# Patient Record
Sex: Male | Born: 1965 | Race: Black or African American | Hispanic: No | Marital: Married | Smoking: Never smoker
Health system: Southern US, Community
[De-identification: ages and names within clinical notes are randomized; demographics above are authoritative.]

## PROBLEM LIST (undated history)

## (undated) DIAGNOSIS — I10 Essential (primary) hypertension: Secondary | ICD-10-CM

---

## 2016-12-26 ENCOUNTER — Emergency Department (HOSPITAL_COMMUNITY)
Admission: EM | Admit: 2016-12-26 | Discharge: 2016-12-27 | Disposition: A | Discharge status: Home / Self Care | Payer: BLUE CROSS/BLUE SHIELD | Attending: Emergency Medicine | Admitting: Emergency Medicine

## 2016-12-26 ENCOUNTER — Encounter (HOSPITAL_COMMUNITY): Payer: Self-pay | Admitting: Emergency Medicine

## 2016-12-26 DIAGNOSIS — N1330 Unspecified hydronephrosis: Secondary | ICD-10-CM | POA: Insufficient documentation

## 2016-12-26 DIAGNOSIS — R1084 Generalized abdominal pain: Secondary | ICD-10-CM | POA: Diagnosis present

## 2016-12-26 DIAGNOSIS — N201 Calculus of ureter: Secondary | ICD-10-CM | POA: Diagnosis not present

## 2016-12-26 DIAGNOSIS — R112 Nausea with vomiting, unspecified: Secondary | ICD-10-CM | POA: Diagnosis not present

## 2016-12-26 DIAGNOSIS — N23 Unspecified renal colic: Secondary | ICD-10-CM

## 2016-12-26 DIAGNOSIS — I10 Essential (primary) hypertension: Secondary | ICD-10-CM | POA: Insufficient documentation

## 2016-12-26 DIAGNOSIS — N134 Hydroureter: Secondary | ICD-10-CM | POA: Insufficient documentation

## 2016-12-26 DIAGNOSIS — K769 Liver disease, unspecified: Secondary | ICD-10-CM | POA: Insufficient documentation

## 2016-12-26 DIAGNOSIS — N200 Calculus of kidney: Secondary | ICD-10-CM | POA: Diagnosis not present

## 2016-12-26 DIAGNOSIS — R748 Abnormal levels of other serum enzymes: Secondary | ICD-10-CM | POA: Insufficient documentation

## 2016-12-26 DIAGNOSIS — R109 Unspecified abdominal pain: Secondary | ICD-10-CM

## 2016-12-26 HISTORY — DX: Essential (primary) hypertension: I10

## 2016-12-26 LAB — COMPREHENSIVE METABOLIC PANEL
ALBUMIN: 4.9 g/dL (ref 3.5–5.0)
ALK PHOS: 60 U/L (ref 38–126)
ALT: 18 U/L (ref 17–63)
ANION GAP: 13 (ref 5–15)
AST: 27 U/L (ref 15–41)
BUN: 13 mg/dL (ref 6–20)
CALCIUM: 10 mg/dL (ref 8.9–10.3)
CHLORIDE: 106 mmol/L (ref 101–111)
CO2: 22 mmol/L (ref 22–32)
Creatinine, Ser: 1.77 mg/dL — ABNORMAL HIGH (ref 0.61–1.24)
GFR calc non Af Amer: 42 mL/min — ABNORMAL LOW (ref >60)
GFR, EST AFRICAN AMERICAN: 49 mL/min — AB (ref >60)
GLUCOSE: 109 mg/dL — AB (ref 65–99)
Potassium: 3.9 mmol/L (ref 3.5–5.1)
SODIUM: 141 mmol/L (ref 135–145)
Total Bilirubin: 2.1 mg/dL — ABNORMAL HIGH (ref 0.3–1.2)
Total Protein: 8 g/dL (ref 6.5–8.1)

## 2016-12-26 LAB — URINALYSIS, ROUTINE W REFLEX MICROSCOPIC
BACTERIA UA: NONE SEEN
BILIRUBIN URINE: NEGATIVE
Glucose, UA: NEGATIVE mg/dL
KETONES UR: 20 mg/dL — AB
Leukocytes, UA: NEGATIVE
Nitrite: NEGATIVE
PH: 5 (ref 5.0–8.0)
PROTEIN: 30 mg/dL — AB
SQUAMOUS EPITHELIAL / LPF: NONE SEEN
Specific Gravity, Urine: 1.023 (ref 1.005–1.030)

## 2016-12-26 LAB — CBC
HEMATOCRIT: 43.2 % (ref 39.0–52.0)
HEMOGLOBIN: 14.8 g/dL (ref 13.0–17.0)
MCH: 29.3 pg (ref 26.0–34.0)
MCHC: 34.3 g/dL (ref 30.0–36.0)
MCV: 85.5 fL (ref 78.0–100.0)
Platelets: 226 10*3/uL (ref 150–400)
RBC: 5.05 MIL/uL (ref 4.22–5.81)
RDW: 13.3 % (ref 11.5–15.5)
WBC: 11.7 10*3/uL — ABNORMAL HIGH (ref 4.0–10.5)

## 2016-12-26 LAB — LIPASE, BLOOD: LIPASE: 20 U/L (ref 11–51)

## 2016-12-26 NOTE — ED Notes (Signed)
Pt from home with c/o generalized abdominal pain since Monday along with constipation. Pt states he has had 3 episodes of emesis today. Pt has hyperactive bowel sounds in all 4 quadrants

## 2016-12-27 ENCOUNTER — Emergency Department (HOSPITAL_COMMUNITY): Payer: BLUE CROSS/BLUE SHIELD

## 2016-12-27 MED ORDER — OXYCODONE-ACETAMINOPHEN 5-325 MG PO TABS
2.0000 | ORAL_TABLET | Freq: Once | ORAL | Status: AC
  Administered 2016-12-27: 2 via ORAL
  Filled 2016-12-27: qty 2

## 2016-12-27 MED ORDER — ONDANSETRON 8 MG PO TBDP: ORAL_TABLET | ORAL | 0 refills | Status: DC

## 2016-12-27 MED ORDER — FENTANYL CITRATE (PF) 100 MCG/2ML IJ SOLN
50.0000 ug | Freq: Once | INTRAMUSCULAR | Status: AC
  Administered 2016-12-27: 50 ug via INTRAVENOUS
  Filled 2016-12-27: qty 2

## 2016-12-27 MED ORDER — SODIUM CHLORIDE 0.9 % IV BOLUS (SEPSIS)
500.0000 mL | Freq: Once | INTRAVENOUS | Status: AC
  Administered 2016-12-27: 500 mL via INTRAVENOUS

## 2016-12-27 MED ORDER — OXYCODONE-ACETAMINOPHEN 5-325 MG PO TABS: 1.0000 | ORAL_TABLET | ORAL | 0 refills | Status: DC | PRN

## 2016-12-27 MED ORDER — TAMSULOSIN HCL 0.4 MG PO CAPS
0.8000 mg | ORAL_CAPSULE | Freq: Once | ORAL | Status: AC
  Administered 2016-12-27: 0.8 mg via ORAL
  Filled 2016-12-27: qty 2

## 2016-12-27 MED ORDER — ONDANSETRON HCL 4 MG/2ML IJ SOLN
4.0000 mg | Freq: Once | INTRAMUSCULAR | Status: AC
  Administered 2016-12-27: 4 mg via INTRAVENOUS
  Filled 2016-12-27: qty 2

## 2016-12-27 MED ORDER — KETOROLAC TROMETHAMINE 30 MG/ML IJ SOLN
30.0000 mg | Freq: Once | INTRAMUSCULAR | Status: AC
Start: 2016-12-27 — End: 2016-12-27
  Administered 2016-12-27: 30 mg via INTRAVENOUS
  Filled 2016-12-27: qty 1

## 2016-12-27 NOTE — Discharge Instructions (Signed)
1. Medications: zofran, percocet, usual home medications 2. Treatment: rest, drink plenty of fluids, advance diet slowly 3. Follow Up: Please followup with your primary doctor in 2-3 days for discussion of your diagnoses, recheck of your labs including kidney function and further evaluation after today's visit; if you do not have a primary care doctor use the resource guide provided to find one; Please return to the ER for persistent vomiting, high fevers or worsening symptoms

## 2016-12-27 NOTE — ED Provider Notes (Signed)
[facility]-EMERGENCY DEPT Provider Note   CSN: [id number] Arrival date & time: 12/26/16  1842     History   Chief Complaint Chief Complaint  Patient presents with  . Abdominal Pain  . Constipation    HPI Austin Cooper is a 51 y.o. male with a hx of HTN presents to the Emergency Department complaining of gradual, persistent, progressively worsening Right flank, RLQ and RUQ pain onset 8am. Associated symptoms include chills, nausea and vomiting.  Emesis was NBNB.  Pt reports last BM was Monday.  Patient was worried about constipation and therefore attempted magnesium citrate without relief.  Nothing makes his flank pain better and palpation makes it worse.  Pt denies fever, chills, headache, neck pain, chest pain, weakness, dizziness, syncope.  Pt denies recent travel, hx of colitis.  Patient denies a history of abdominal surgeries.   The history is provided by the patient and medical records. No language interpreter was used.    Past Medical History:  Diagnosis Date  . Hypertension     There are no active problems to display for this patient.   History reviewed. No pertinent surgical history.     Home Medications    Prior to Admission medications   Medication Sig Start Date End Date Taking? Authorizing Provider  amLODipine-benazepril (LOTREL) 10-20 MG capsule Take 1 capsule by mouth daily. 12/24/16  Yes [provider]  ondansetron (ZOFRAN ODT) 8 MG disintegrating tablet 8mg  ODT q4 hours prn nausea 12/27/16   Herman Mell, Dahlia ClientHannah, PA-C  oxyCODONE-acetaminophen (PERCOCET) 5-325 MG tablet Take 1-2 tablets by mouth every 4 (four) hours as needed. 12/27/16   Ichiro Chesnut, Dahlia ClientHannah, PA-C    Family History No family history on file.  Social History Social History  Substance Use Topics  . Smoking status: Never Smoker  . Smokeless tobacco: Never Used  . Alcohol use No     Allergies   Patient has no known allergies.   Review of Systems Review of Systems    Constitutional: Negative for appetite change, diaphoresis, fatigue, fever and unexpected weight change.  HENT: Negative for mouth sores.   Eyes: Negative for visual disturbance.  Respiratory: Negative for cough, chest tightness, shortness of breath and wheezing.   Cardiovascular: Negative for chest pain.  Gastrointestinal: Positive for abdominal pain, constipation, nausea and vomiting. Negative for diarrhea.  Endocrine: Negative for polydipsia, polyphagia and polyuria.  Genitourinary: Negative for dysuria, frequency, hematuria and urgency.  Musculoskeletal: Negative for back pain and neck stiffness.  Skin: Negative for rash.  Allergic/Immunologic: Negative for immunocompromised state.  Neurological: Negative for syncope, light-headedness and headaches.  Hematological: Does not bruise/bleed easily.  Psychiatric/Behavioral: Negative for sleep disturbance. The patient is not nervous/anxious.   All other systems reviewed and are negative.    Physical Exam Updated Vital Signs BP 121/88 (BP Location: Left Arm)   Pulse 82   Temp 99 F (37.2 C) (Oral)   Resp 18   Ht 5\' 11"  (1.803 m)   Wt 91.6 kg (202 lb)   SpO2 99%   BMI 28.17 kg/m   Physical Exam  Constitutional: He appears well-developed and well-nourished. No distress.  Awake, alert, nontoxic appearance  HENT:  Head: Normocephalic and atraumatic.  Mouth/Throat: Oropharynx is clear and moist. No oropharyngeal exudate.  Eyes: Conjunctivae are normal. No scleral icterus.  Neck: Normal range of motion. Neck supple.  Cardiovascular: Normal rate, regular rhythm and intact distal pulses.   Pulmonary/Chest: Effort normal and breath sounds normal. No respiratory distress. He has no wheezes.  Equal  chest expansion  Abdominal: Soft. Bowel sounds are normal. He exhibits no mass. There is tenderness in the right upper quadrant and right lower quadrant. There is CVA tenderness ( right). There is no rigidity, no rebound, no guarding, no  tenderness at McBurney's point and negative Murphy's sign.  Musculoskeletal: Normal range of motion. He exhibits no edema.  Neurological: He is alert.  Speech is clear and goal oriented Moves extremities without ataxia  Skin: Skin is warm and dry. He is not diaphoretic.  Psychiatric: He has a normal mood and affect.  Nursing note and vitals reviewed.    ED Treatments / Results  Labs (all labs ordered are listed, but only abnormal results are displayed) Labs Reviewed  COMPREHENSIVE METABOLIC PANEL - Abnormal; Notable for the following:       Result Value   Glucose, Bld 109 (*)    Creatinine, Ser 1.77 (*)    Total Bilirubin 2.1 (*)    GFR calc non Af Amer 42 (*)    GFR calc Af Amer 49 (*)    All other components within normal limits  CBC - Abnormal; Notable for the following:    WBC 11.7 (*)    All other components within normal limits  URINALYSIS, ROUTINE W REFLEX MICROSCOPIC - Abnormal; Notable for the following:    Hgb urine dipstick LARGE (*)    Ketones, ur 20 (*)    Protein, ur 30 (*)    All other components within normal limits  LIPASE, BLOOD     Radiology Ct Renal Stone Study  Result Date: 12/27/2016 CLINICAL DATA:  Constipation x4 days with nausea and vomiting x1 day. Right flank pain moving to central lower abdomen. Leukocytosis. EXAM: CT ABDOMEN AND PELVIS WITHOUT CONTRAST TECHNIQUE: Multidetector CT imaging of the abdomen and pelvis was performed following the standard protocol without IV contrast. COMPARISON:  None. FINDINGS: Lower chest: No acute abnormality. The included heart is normal in size without pericardial effusion. There is minimal bibasilar atelectasis. Hepatobiliary: Within the left hepatic lobe is a subtle hypodense masslike abnormality measuring 7.7 x 5.4 x 6.9 cm. Further characterization is not possible given lack of IV contrast. Potentially, this could represent a large cavernous hemangioma other etiologies including carcinoma or metastasis cannot be  entirely excluded. This be further correlated with CT or MRI using a liver lesion protocol and contrast. The gallbladder is unremarkable. Pancreas: Unremarkable. No pancreatic ductal dilatation or surrounding inflammatory changes. Spleen: Normal in size without focal abnormality. Adrenals/Urinary Tract: Normal bilateral adrenal glands. Perinephric stranding with mild right-sided nephromegaly and mild-to-moderate hydroureteronephrosis is identified with a 3 mm calculus within the interstitial bladder portion of the right ureter, almost having passed into the bladder. There are least for or so renal calculi without obstruction on the left the largest is in the upper pole measuring approximately 4 mm. The urinary bladder is physiologically distended. Stomach/Bowel: Stomach is within normal limits. Appendix appears normal. No evidence of bowel wall thickening, distention, or inflammatory changes. Vascular/Lymphatic: Aortoiliac atherosclerosis.  No lymphadenopathy. Reproductive: Prostate is unremarkable. Other: No abdominal wall hernia or abnormality. No abdominopelvic ascites. Musculoskeletal: Lumbar spondylosis with multilevel mild disc space narrowing and broad-based disc bulges from L2 through L5. No acute nor suspicious osseous abnormalities. IMPRESSION: 1. 3 mm distal right ureteral stone almost having passed into the bladder causing mild-to-moderate right-sided hydroureteronephrosis, perinephric fat stranding and nephromegaly. 2. At least 4 or so nonobstructing left-sided renal calculi the largest is 4 mm in the upper pole. 3. Subtle hypodense masslike  abnormality of the left hepatic lobe measuring 7.7 x 5.4 x 6.9 cm. Further characterization not possible given lack of IV contrast. Potentially this could represent a large cavernous hemangioma. Other etiologies are not excluded. Would recommend dedicated non emergent CT or MRI without and with IV contrast using a liver lesion protocol for better correlation unless  this has been documented elsewhere. 4. Lower lumbar degenerative disc disease L2 through L5 with broad-based disc bulges. Electronically Signed   By: Tollie Eth M.D.   On: 12/27/2016 02:34    Procedures Procedures (including critical care time)  Medications Ordered in ED Medications  fentaNYL (SUBLIMAZE) injection 50 mcg (50 mcg Intravenous Given 12/27/16 0151)  ondansetron (ZOFRAN) injection 4 mg (4 mg Intravenous Given 12/27/16 0149)  tamsulosin (FLOMAX) capsule 0.8 mg (0.8 mg Oral Given 12/27/16 0332)  sodium chloride 0.9 % bolus 500 mL (500 mLs Intravenous New Bag/Given 12/27/16 0331)  ketorolac (TORADOL) 30 MG/ML injection 30 mg (30 mg Intravenous Given 12/27/16 0332)  oxyCODONE-acetaminophen (PERCOCET/ROXICET) 5-325 MG per tablet 2 tablet (2 tablets Oral Given 12/27/16 0449)     Initial Impression / Assessment and Plan / ED Course  I have reviewed the triage vital signs and the nursing notes.  Pertinent labs & imaging results that were available during my care of the patient were reviewed by me and considered in my medical decision making (see chart for details).  Clinical Course as of Dec 27 448  Fri Dec 27, 2016  8119 Liver lesion noted as incidental finding.  Pt given a copy of CT scan and recommend close follow-up and repeat imaging with PCP.  Pt states understanding and plans for f/u.  [HM]    Clinical Course User Index [HM] Mairead Schwarzkopf, Dahlia Client, PA-C    Pt has been diagnosed with a Kidney Stone via CT. There is no evidence of significant hydronephrosis, serum creatine slightly elevated, but stone is distal and there is no evidence of infection. Gentle hydration given.  No known baseline creatinine. Vital signs stable and the pt does not have irratractable vomiting. Pt will be dc home with pain medications & has been advised to follow up with PCP and urology for lab recheck in 2 days.   Pt states understanding and is in agreement with the plan.     Final Clinical  Impressions(s) / ED Diagnoses   Final diagnoses:  Abdominal pain, unspecified abdominal location  Ureteral colic  Elevated creatine kinase  Liver lesion    New Prescriptions New Prescriptions   ONDANSETRON (ZOFRAN ODT) 8 MG DISINTEGRATING TABLET    8mg  ODT q4 hours prn nausea   OXYCODONE-ACETAMINOPHEN (PERCOCET) 5-325 MG TABLET    Take 1-2 tablets by mouth every 4 (four) hours as needed.     Mylissa Lambe, Boyd Kerbs 12/27/16 0340    Kassondra Geil, Dahlia Client, PA-C 12/27/16 1478    Zadie Rhine, MD 12/27/16 [phone]

## 2019-01-08 IMAGING — CT CT RENAL STONE PROTOCOL
2 of 3 series · 15 of 46 positions shown, 17 images · non-contrast
Comparison: None.

CLINICAL DATA: Constipation x4 days with nausea and vomiting x1
day. Right flank pain moving to central lower abdomen. Leukocytosis.

EXAM:
CT ABDOMEN AND PELVIS WITHOUT CONTRAST
TECHNIQUE: Multidetector CT imaging of the abdomen and pelvis was performed
following the standard protocol without IV contrast.

[Series 3: lung · axial · 0.67mm/px · z∈[+1484,+1554]mm · 12 of 41 slices shown, 14 images]
[im 3/41  soft-tissue]
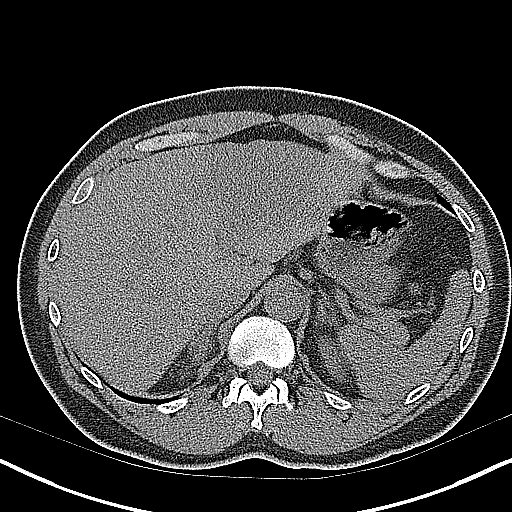
[im 3/41  bone]
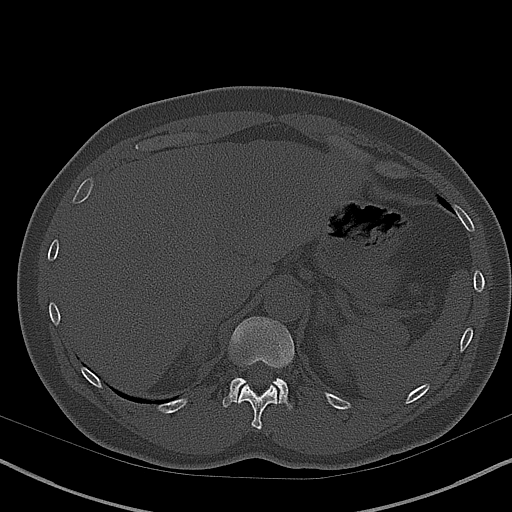
[im 6/41  soft-tissue]
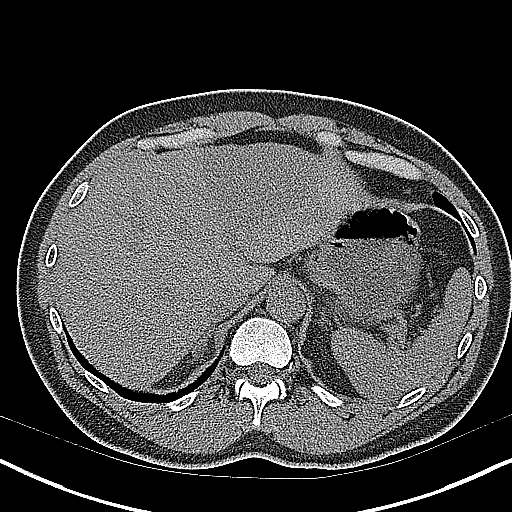
[im 10/41  soft-tissue]
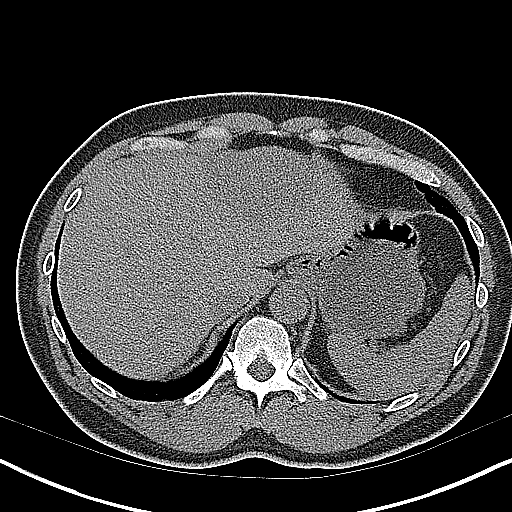
[im 12/41  soft-tissue]
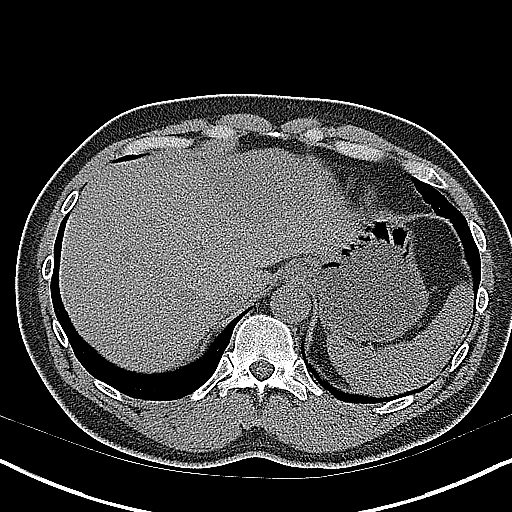
[im 16/41  soft-tissue]
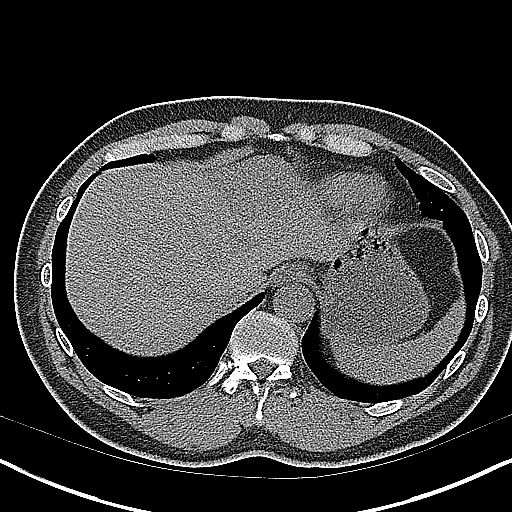
[im 19/41  soft-tissue]
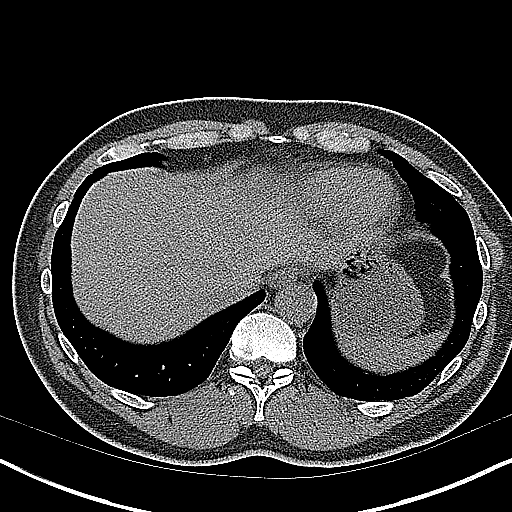
[im 22/41  soft-tissue]
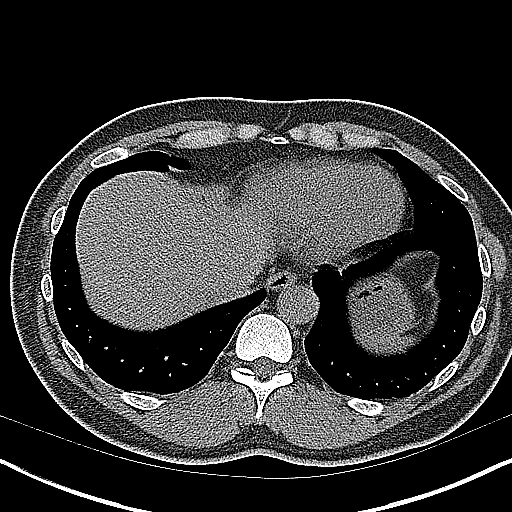
[im 25/41  soft-tissue]
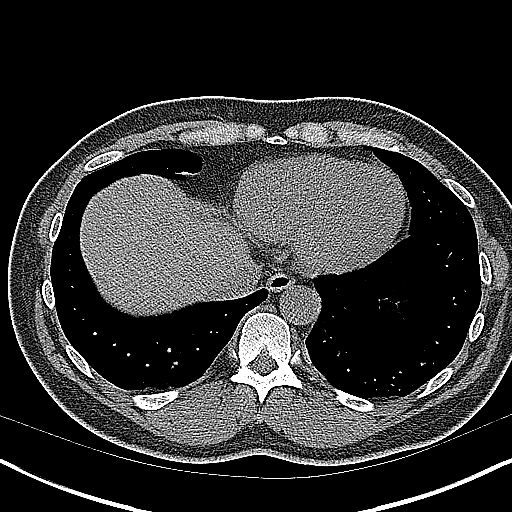
[im 29/41  soft-tissue]
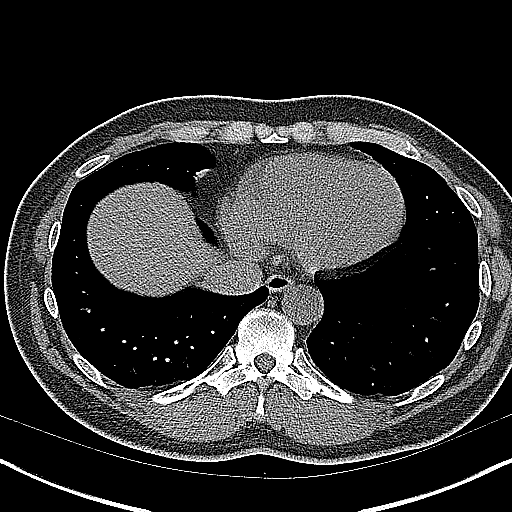
[im 29/41  bone]
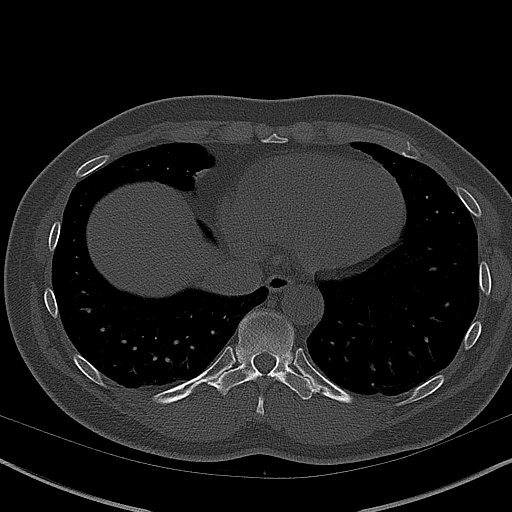
[im 31/41  soft-tissue]
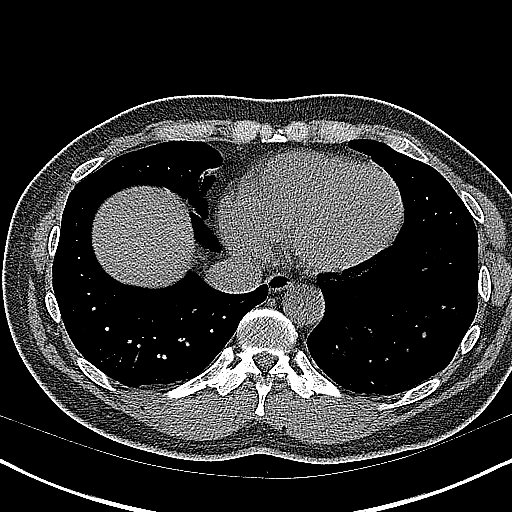
[im 35/41  soft-tissue]
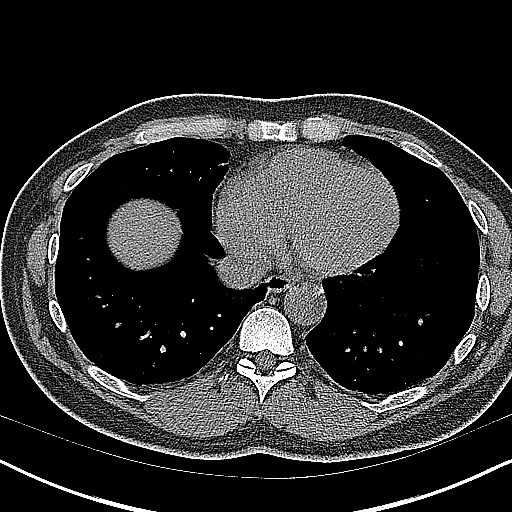
[im 38/41  soft-tissue]
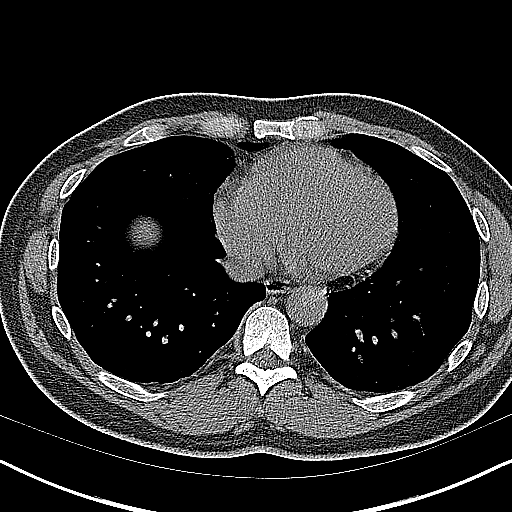

[Series 4: coronal · coronal · 0.69mm/px · 3 of 130 slices shown]
[im 44/130  soft-tissue]
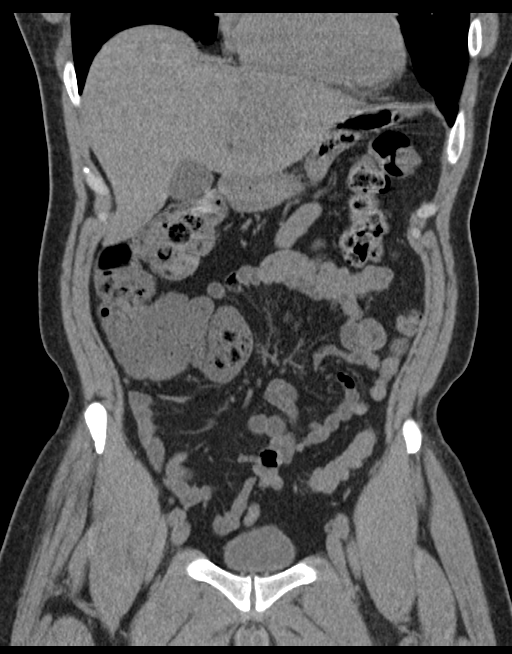
[im 58/130  soft-tissue]
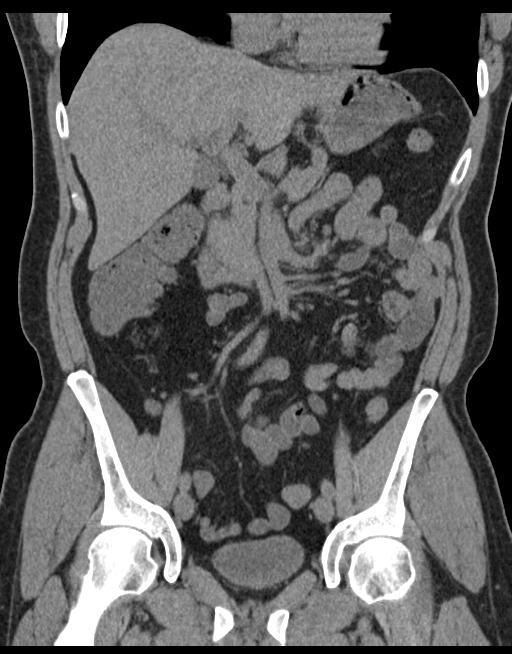
[im 72/130  soft-tissue]
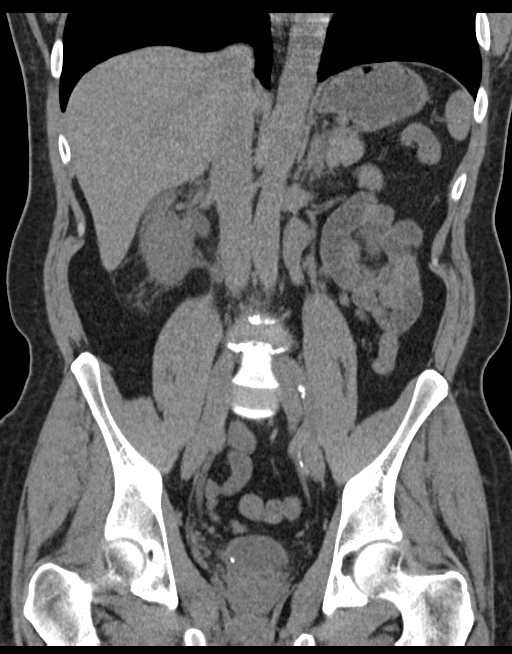

[15 of 46 positions shown; findings below may reference images not displayed]

FINDINGS: Lower chest: No acute abnormality. The included heart is normal in
size without pericardial effusion. There is minimal bibasilar
atelectasis.

Hepatobiliary: Within the left hepatic lobe is a subtle hypodense
masslike abnormality measuring 7.7 x 5.4 x 6.9 cm. Further
characterization is not possible given lack of IV contrast.
Potentially, this could represent a large cavernous hemangioma other
etiologies including carcinoma or metastasis cannot be entirely
excluded. This be further correlated with CT or MRI using a liver
lesion protocol and contrast. The gallbladder is unremarkable.

Pancreas: Unremarkable. No pancreatic ductal dilatation or
surrounding inflammatory changes.

Spleen: Normal in size without focal abnormality.

Adrenals/Urinary Tract: Normal bilateral adrenal glands. Perinephric
stranding with mild right-sided nephromegaly and mild-to-moderate
hydroureteronephrosis is identified with a 3 mm calculus within the
interstitial bladder portion of the right ureter, almost having
passed into the bladder. There are least for or so renal calculi
without obstruction on the left the largest is in the upper pole
measuring approximately 4 mm. The urinary bladder is physiologically
distended.

Stomach/Bowel: Stomach is within normal limits. Appendix appears
normal. No evidence of bowel wall thickening, distention, or
inflammatory changes.

Vascular/Lymphatic: Aortoiliac atherosclerosis.  No lymphadenopathy.

Reproductive: Prostate is unremarkable.

Other: No abdominal wall hernia or abnormality. No abdominopelvic
ascites.

Musculoskeletal: Lumbar spondylosis with multilevel mild disc space
narrowing and broad-based disc bulges from L2 through L5. No acute
nor suspicious osseous abnormalities.
IMPRESSION: 1. 3 mm distal right ureteral stone almost having passed into the
bladder causing mild-to-moderate right-sided hydroureteronephrosis,
perinephric fat stranding and nephromegaly.
2. At least 4 or so nonobstructing left-sided renal calculi the
largest is 4 mm in the upper pole.
3. Subtle hypodense masslike abnormality of the left hepatic lobe
measuring 7.7 x 5.4 x 6.9 cm. Further characterization not possible
given lack of IV contrast. Potentially this could represent a large
cavernous hemangioma. Other etiologies are not excluded. Would
recommend dedicated non emergent CT or MRI without and with IV
contrast using a liver lesion protocol for better correlation unless
this has been documented elsewhere.
4. Lower lumbar degenerative disc disease L2 through L5 with
broad-based disc bulges.

## 2019-08-15 ENCOUNTER — Ambulatory Visit: Payer: Self-pay | Attending: Internal Medicine

## 2019-08-15 DIAGNOSIS — Z23 Encounter for immunization: Secondary | ICD-10-CM | POA: Insufficient documentation

## 2019-08-15 NOTE — Progress Notes (Signed)
   Covid-19 Vaccination Clinic  Name:  YASHAR INCLAN    MRN: [id number] DOB: 1966/02/07  08/15/2019  Mr. Tesar was observed post Covid-19 immunization for 15 minutes without incident. He was provided with Vaccine Information Sheet and instruction to access the V-Safe system.   Mr. Shavers was instructed to call 911 with any severe reactions post vaccine: Marland Kitchen Difficulty breathing  . Swelling of face and throat  . A fast heartbeat  . A bad rash all over body  . Dizziness and weakness   Immunizations Administered    Name Date Dose VIS Date Route   Pfizer COVID-19 Vaccine 08/15/2019  5:51 PM 0.3 mL 05/21/2019 Intramuscular   Manufacturer: [employer], [employer]   Lot: [id number]   NDC: [address]-6

## 2019-09-15 ENCOUNTER — Ambulatory Visit: Payer: Self-pay | Attending: Internal Medicine

## 2019-09-15 DIAGNOSIS — Z23 Encounter for immunization: Secondary | ICD-10-CM

## 2019-09-15 NOTE — Progress Notes (Signed)
   Covid-19 Vaccination Clinic  Name:  Austin Cooper    MRN: [id number] DOB: 1966-02-01  09/15/2019  [name] was observed post Covid-19 immunization for 15 minutes without incident. He was provided with Vaccine Information Sheet and instruction to access the V-Safe system.   [name] was instructed to call 911 with any severe reactions post vaccine: Marland Kitchen Difficulty breathing  . Swelling of face and throat  . A fast heartbeat  . A bad rash all over body  . Dizziness and weakness   Immunizations Administered    Name Date Dose VIS Date Route   Pfizer COVID-19 Vaccine 09/15/2019  9:05 AM 0.3 mL 05/21/2019 Intramuscular   Manufacturer: [employer], [employer]   Lot: [id number]   NDC: [address]-5

## 2020-04-08 ENCOUNTER — Ambulatory Visit: Payer: Self-pay | Attending: Internal Medicine

## 2020-04-08 DIAGNOSIS — Z23 Encounter for immunization: Secondary | ICD-10-CM

## 2020-04-08 NOTE — Progress Notes (Signed)
   Covid-19 Vaccination Clinic  Name:  Austin Cooper    MRN: [id number] DOB: Jun 08, 1966  04/08/2020  Austin Cooper was observed post Covid-19 immunization for 15 minutes without incident. He was provided with Vaccine Information Sheet and instruction to access the V-Safe system.   Austin Cooper was instructed to call 911 with any severe reactions post vaccine: Marland Kitchen Difficulty breathing  . Swelling of face and throat  . A fast heartbeat  . A bad rash all over body  . Dizziness and weakness

## 2023-05-23 ENCOUNTER — Emergency Department (HOSPITAL_COMMUNITY): Payer: BC Managed Care – PPO

## 2023-05-23 ENCOUNTER — Other Ambulatory Visit: Payer: Self-pay

## 2023-05-23 ENCOUNTER — Emergency Department (HOSPITAL_COMMUNITY)
Admission: EM | Admit: 2023-05-23 | Discharge: 2023-05-23 | Disposition: A | Discharge status: Home / Self Care | Payer: BC Managed Care – PPO | Attending: Emergency Medicine | Admitting: Emergency Medicine

## 2023-05-23 DIAGNOSIS — D72829 Elevated white blood cell count, unspecified: Secondary | ICD-10-CM | POA: Diagnosis not present

## 2023-05-23 DIAGNOSIS — N132 Hydronephrosis with renal and ureteral calculous obstruction: Secondary | ICD-10-CM | POA: Diagnosis not present

## 2023-05-23 DIAGNOSIS — N200 Calculus of kidney: Secondary | ICD-10-CM

## 2023-05-23 DIAGNOSIS — R1033 Periumbilical pain: Secondary | ICD-10-CM | POA: Diagnosis present

## 2023-05-23 LAB — URINALYSIS, ROUTINE W REFLEX MICROSCOPIC
Bacteria, UA: NONE SEEN
Bilirubin Urine: NEGATIVE
Glucose, UA: NEGATIVE mg/dL
Hgb urine dipstick: NEGATIVE
Ketones, ur: 20 mg/dL — AB
Leukocytes,Ua: NEGATIVE
Nitrite: NEGATIVE
Protein, ur: NEGATIVE mg/dL
Specific Gravity, Urine: 1.015 (ref 1.005–1.030)
pH: 6 (ref 5.0–8.0)

## 2023-05-23 LAB — CBC
HCT: 42.5 % (ref 39.0–52.0)
Hemoglobin: 14 g/dL (ref 13.0–17.0)
MCH: 29 pg (ref 26.0–34.0)
MCHC: 32.9 g/dL (ref 30.0–36.0)
MCV: 88 fL (ref 80.0–100.0)
Platelets: 245 10*3/uL (ref 150–400)
RBC: 4.83 MIL/uL (ref 4.22–5.81)
RDW: 13 % (ref 11.5–15.5)
WBC: 11.6 10*3/uL — ABNORMAL HIGH (ref 4.0–10.5)
nRBC: 0 % (ref 0.0–0.2)

## 2023-05-23 LAB — COMPREHENSIVE METABOLIC PANEL
ALT: 19 U/L (ref 0–44)
AST: 22 U/L (ref 15–41)
Albumin: 4.4 g/dL (ref 3.5–5.0)
Alkaline Phosphatase: 61 U/L (ref 38–126)
Anion gap: 11 (ref 5–15)
BUN: 17 mg/dL (ref 6–20)
CO2: 22 mmol/L (ref 22–32)
Calcium: 9.9 mg/dL (ref 8.9–10.3)
Chloride: 105 mmol/L (ref 98–111)
Creatinine, Ser: 1.99 mg/dL — ABNORMAL HIGH (ref 0.61–1.24)
GFR, Estimated: 38 mL/min — ABNORMAL LOW (ref >60)
Glucose, Bld: 115 mg/dL — ABNORMAL HIGH (ref 70–99)
Potassium: 3.7 mmol/L (ref 3.5–5.1)
Sodium: 138 mmol/L (ref 135–145)
Total Bilirubin: 1.8 mg/dL — ABNORMAL HIGH (ref <1.2)
Total Protein: 7.3 g/dL (ref 6.5–8.1)

## 2023-05-23 LAB — LIPASE, BLOOD: Lipase: 25 U/L (ref 11–51)

## 2023-05-23 MED ORDER — ONDANSETRON 4 MG PO TBDP: 4.0000 mg | ORAL_TABLET | Freq: Three times a day (TID) | ORAL | 0 refills | Status: AC | PRN

## 2023-05-23 MED ORDER — ONDANSETRON HCL 4 MG/2ML IJ SOLN
4.0000 mg | Freq: Once | INTRAMUSCULAR | Status: AC
  Administered 2023-05-23: 4 mg via INTRAVENOUS
  Filled 2023-05-23: qty 2

## 2023-05-23 MED ORDER — MORPHINE SULFATE (PF) 4 MG/ML IV SOLN
4.0000 mg | Freq: Once | INTRAVENOUS | Status: AC
  Administered 2023-05-23: 4 mg via INTRAVENOUS
  Filled 2023-05-23: qty 1

## 2023-05-23 MED ORDER — HYDROCODONE-ACETAMINOPHEN 5-325 MG PO TABS: 1.0000 | ORAL_TABLET | Freq: Four times a day (QID) | ORAL | 0 refills | Status: AC | PRN

## 2023-05-23 MED ORDER — IOHEXOL 350 MG/ML SOLN
75.0000 mL | Freq: Once | INTRAVENOUS | Status: AC | PRN
  Administered 2023-05-23: 75 mL via INTRAVENOUS

## 2023-05-23 MED ORDER — TAMSULOSIN HCL 0.4 MG PO CAPS: 0.4000 mg | ORAL_CAPSULE | Freq: Every day | ORAL | 0 refills | Status: AC

## 2023-05-23 NOTE — ED Provider Notes (Signed)
  Physical Exam  BP 127/78   Pulse 68   Temp 99.3 F (37.4 C) (Oral)   Resp 16   SpO2 98%   Physical Exam Vitals and nursing note reviewed.  Constitutional:      General: He is not in acute distress.    Appearance: He is not toxic-appearing.  HENT:     Head: Normocephalic and atraumatic.  Eyes:     General: No scleral icterus.    Conjunctiva/sclera: Conjunctivae normal.  Cardiovascular:     Rate and Rhythm: Normal rate and regular rhythm.     Pulses: Normal pulses.     Heart sounds: Normal heart sounds.  Pulmonary:     Effort: Pulmonary effort is normal. No respiratory distress.     Breath sounds: Normal breath sounds.  Abdominal:     General: Abdomen is flat. Bowel sounds are normal.     Palpations: Abdomen is soft.     Tenderness: There is no abdominal tenderness.  Skin:    General: Skin is warm and dry.     Findings: No lesion.  Neurological:     General: No focal deficit present.     Mental Status: He is alert and oriented to person, place, and time. Mental status is at baseline.     Procedures  Procedures  ED Course / MDM   Clinical Course as of 05/23/23 0706  Fri May 23, 2023  5284 Periumbilical pain - dull ache, NV 12/12, decreased appetite for 1 day.   [JB]    Clinical Course User Index [JB] Ismahan Lippman, Horald Chestnut, PA-C   Medical Decision Making Amount and/or Complexity of Data Reviewed Labs: ordered. Radiology: ordered.  Risk Prescription drug management.   Patient was received at signout from prior ED PA [facility].  Dispo is pending abdominal scan, PO challenge, likely stable for discharge.  Please for interview her prior note for further information.  Labs and imaging discussed with patient and family.  All questions answered and agreed to plan. Pain and nausea under control at this time.   Patient's CT scan shows 3 mm obstructing kidney stone.  Given that patient is hemodynamically stable with no signs of systemic illness, no significant elevation in  creatinine and no urinary tract infection I feel patient would do well in outpatient setting.  Patient is stable for discharge.  Not currently following with urology thus I provided with on-call urologist number to schedule appointment.  Discussed following up in under 7 days for further evaluation.  Discussed return precautions.        Smitty Knudsen, PA-C 05/23/23 1324    Rexford Maus, DO 05/24/23 1542

## 2023-05-23 NOTE — Discharge Instructions (Addendum)
You were seen in the emergency room today and found to have kidney stone.  Because your creatinine a lab that monitors your kidney function is elevated I would recommend staying away from NSAIDs or anti-inflammatories like ibuprofen.  For pain control I would recommend Tylenol, if you have breakthrough pain you can take the Norco which I sent to your pharmacy.  I have also sent Zofran which is a medication for nausea.   It is important to stay well-hydrated. I have also sent tamsulosin which is a medication to help with with passing stone, if you have side effects from this medications you can discontinue.  Return to emergency room with new or worsening symptoms.

## 2023-05-23 NOTE — ED Notes (Signed)
Pt requested pain meds.  Message sent to PA.

## 2023-05-23 NOTE — ED Triage Notes (Signed)
Patient reports mid abdominal pain with emesis and diarrhea onset last night , no fever or chills.

## 2023-05-23 NOTE — ED Notes (Signed)
Patient discharged by this RN. Patient verbalizes understanding of discharge instructions with no additional instructions. Patient ambulatory with family to lobby at time of discharge.

## 2023-05-23 NOTE — ED Provider Notes (Signed)
Austin Cooper EMERGENCY DEPARTMENT AT [facility] Provider Note   CSN: [id number] Arrival date & time: 05/23/23  3016     History Chief Complaint  Patient presents with   Abdominal Pain    Austin Cooper is a 57 y.o. male with history of hypertension, hyperlipidemia presents emerged part today for evaluation of periumbilical abdominal pain that started yesterday.  Reports that it was more periumbilical and does not radiate.  Describes it as a dull ache sensation is 6 out of 10.  Reports that yesterday he had some nausea and vomiting around 10 episodes with some of the being dry heaving.  Nonbloody or bilious in emesis.  He denies any vomiting today.  Denies any diarrhea.  Reports more in constipation but did have a bowel movement yesterday.  Reports it was small amount and hard.  Has not been able to pass any gas.  Denies any hematochezia or any melena.  He reports that he did take a suppository yesterday with Dulcolax and was unable to produce a bowel movement however did report he felt some improvement with his pain.  Denies any dysuria, hematuria, or any fevers.  He reports that the symptoms started around an hour after eating [employer] burger.  He denies any radiation of the pain.  Denies any issues with his testicles or penis.  He reports he has had kidney stones before 10 years ago.  No known drug allergies.  Denies any tobacco, EtOH, other drug use.  No medications tried prior to arrival for pain.   Abdominal Pain Associated symptoms: constipation, nausea and vomiting   Associated symptoms: no chest pain, no chills, no diarrhea, no dysuria, no fever, no hematuria and no shortness of breath        Home Medications Prior to Admission medications   Medication Sig Start Date End Date Taking? Authorizing Provider  amLODipine-benazepril (LOTREL) 10-20 MG capsule Take 1 capsule by mouth daily. 12/24/16   [provider]  ondansetron (ZOFRAN ODT) 8 MG disintegrating  tablet 8mg  ODT q4 hours prn nausea 12/27/16   Austin Cooper, Austin Client, PA-C  oxyCODONE-acetaminophen (PERCOCET) 5-325 MG tablet Take 1-2 tablets by mouth every 4 (four) hours as needed. 12/27/16   Austin Cooper, Austin Client, PA-C      Allergies    Patient has no known allergies.    Review of Systems   Review of Systems  Constitutional:  Negative for chills and fever.  Respiratory:  Negative for shortness of breath.   Cardiovascular:  Negative for chest pain.  Gastrointestinal:  Positive for abdominal pain, constipation, nausea and vomiting. Negative for blood in stool and diarrhea.  Genitourinary:  Negative for dysuria, flank pain, hematuria, penile discharge, penile pain, penile swelling, scrotal swelling and testicular pain.    Physical Exam Updated Vital Signs BP (!) 160/92   Pulse 69   Temp 99.3 F (37.4 C) (Oral)   Resp 16   SpO2 97%  Physical Exam Vitals and nursing note reviewed.  Constitutional:      General: He is not in acute distress.    Appearance: He is not ill-appearing or toxic-appearing.  Eyes:     General: No scleral icterus. Cardiovascular:     Rate and Rhythm: Normal rate.  Pulmonary:     Effort: Pulmonary effort is normal. No respiratory distress.  Abdominal:     General: There is no distension.     Palpations: Abdomen is soft.     Tenderness: There is abdominal tenderness in the periumbilical area. There is  no right CVA tenderness, left CVA tenderness, guarding or rebound.  Neurological:     Mental Status: He is alert.     ED Results / Procedures / Treatments   Labs (all labs ordered are listed, but only abnormal results are displayed) Labs Reviewed  COMPREHENSIVE METABOLIC PANEL - Abnormal; Notable for the following components:      Result Value   Glucose, Bld 115 (*)    Creatinine, Ser 1.99 (*)    Total Bilirubin 1.8 (*)    GFR, Estimated 38 (*)    All other components within normal limits  CBC - Abnormal; Notable for the following components:   WBC  11.6 (*)    All other components within normal limits  URINALYSIS, ROUTINE W REFLEX MICROSCOPIC - Abnormal; Notable for the following components:   Ketones, ur 20 (*)    All other components within normal limits  LIPASE, BLOOD    EKG None  Radiology No results found.  Procedures Procedures   Medications Ordered in ED Medications  morphine (PF) 4 MG/ML injection 4 mg (4 mg Intravenous Given 05/23/23 0626)    ED Course/ Medical Decision Making/ A&P Clinical Course as of 05/23/23 0641  Fri May 23, 2023  1610 Periumbilical pain - dull ache, NV 12/12, decreased appetite for 1 day.   [JB]    Clinical Course User Index [JB] Cooper, Austin Chestnut, PA-C   Medical Decision Making Amount and/or Complexity of Data Reviewed Labs: ordered. Radiology: ordered.  Risk Prescription drug management.   57 y.o. male presents to the ER for evaluation of periumbilical abdominal pain. Differential diagnosis includes but is not limited to AAA, mesenteric ischemia, appendicitis, diverticulitis, DKA, gastroenteritis, nephrolithiasis, pancreatitis, constipation, UTI, bowel obstruction, biliary disease, IBD, PUD, hepatitis. Vital signs mildly elevated BP otherwise unremarkable. Physical exam as noted above.   I independently reviewed and interpreted the patient's labs.  CBC shows mildly elevated white blood cell count 1.6.  No anemia.  Lipase within normal limits.  Urinalysis shows 20 ketones otherwise unremarkable.  CMP shows glucose 115.  Creatinine 1.9 with a total bili of 1.8.  These appear to be chronically elevated.  No other electrolyte or LFT abnormality.  Morphine ordered for pain. Currently not experiencing nausea. CT Abd ordered. Will hand off to oncoming shift.   6:42 AM Care of Austin Cooper  transferred to PA Austin Cooper at the end of my shift as the patient will require reassessment once labs/imaging have resulted. Patient presentation, ED course, and plan of care discussed with review  of all pertinent labs and imaging. Please see his/her note for further details regarding further ED course and disposition. Plan at time of handoff is follow up with CT imaging and treat accordingly. This may be altered or completely changed at the discretion of the oncoming team pending results of further workup.   Portions of this report may have been transcribed using voice recognition software. Every effort was made to ensure accuracy; however, inadvertent computerized transcription errors may be present.   Final Clinical Impression(s) / ED Diagnoses Final diagnoses:  None    Rx / DC Orders ED Discharge Orders     None         Achille Rich, PA-C 05/23/23 9604    Glynn Octave, MD 05/23/23 [id number]

## 2023-05-23 NOTE — ED Notes (Signed)
PA at bedside.
